# Patient Record
Sex: Female | Born: 2005 | Race: White | Hispanic: No | Marital: Single | Smoking: Never smoker
Health system: Southern US, Community
[De-identification: ages and names within clinical notes are randomized; demographics above are authoritative.]

## PROBLEM LIST (undated history)

## (undated) DIAGNOSIS — R109 Unspecified abdominal pain: Secondary | ICD-10-CM

## (undated) DIAGNOSIS — K219 Gastro-esophageal reflux disease without esophagitis: Secondary | ICD-10-CM

## (undated) DIAGNOSIS — G43909 Migraine, unspecified, not intractable, without status migrainosus: Secondary | ICD-10-CM

## (undated) DIAGNOSIS — G8929 Other chronic pain: Secondary | ICD-10-CM

## (undated) DIAGNOSIS — R1032 Left lower quadrant pain: Secondary | ICD-10-CM

## (undated) DIAGNOSIS — R519 Headache, unspecified: Secondary | ICD-10-CM

## (undated) DIAGNOSIS — N83209 Unspecified ovarian cyst, unspecified side: Secondary | ICD-10-CM

## (undated) DIAGNOSIS — R1031 Right lower quadrant pain: Secondary | ICD-10-CM

## (undated) DIAGNOSIS — R51 Headache: Secondary | ICD-10-CM

## (undated) DIAGNOSIS — K529 Noninfective gastroenteritis and colitis, unspecified: Secondary | ICD-10-CM

## (undated) HISTORY — DX: Left lower quadrant pain: R10.32

## (undated) HISTORY — DX: Migraine, unspecified, not intractable, without status migrainosus: G43.909

## (undated) HISTORY — DX: Headache: R51

## (undated) HISTORY — DX: Gastro-esophageal reflux disease without esophagitis: K21.9

## (undated) HISTORY — DX: Headache, unspecified: R51.9

## (undated) HISTORY — DX: Right lower quadrant pain: R10.31

## (undated) HISTORY — DX: Noninfective gastroenteritis and colitis, unspecified: K52.9

## (undated) HISTORY — DX: Other chronic pain: G89.29

## (undated) HISTORY — DX: Unspecified ovarian cyst, unspecified side: N83.209

## (undated) HISTORY — DX: Unspecified abdominal pain: R10.9

---
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
## 2024-04-16 ENCOUNTER — Ambulatory Visit: Admitting: Family Medicine
